# Patient Record
Sex: Male | Born: 1955 | Race: Asian | Hispanic: No | Marital: Married | Smoking: Former smoker
Health system: Southern US, Community
[De-identification: ages and names within clinical notes are randomized; demographics above are authoritative.]

---

## 2007-12-01 ENCOUNTER — Emergency Department (HOSPITAL_COMMUNITY): Admission: EM | Admit: 2007-12-01 | Discharge: 2007-12-01 | Payer: Self-pay | Admitting: Family Medicine

## 2007-12-15 ENCOUNTER — Ambulatory Visit: Payer: Self-pay | Admitting: Internal Medicine

## 2007-12-15 LAB — CONVERTED CEMR LAB
Basophils Absolute: 0 10*3/uL (ref 0.0–0.1)
Basophils Relative: 0 % (ref 0–1)
CO2: 24 meq/L (ref 19–32)
Calcium: 9.6 mg/dL (ref 8.4–10.5)
Chloride: 107 meq/L (ref 96–112)
Creatinine, Ser: 1.01 mg/dL (ref 0.40–1.50)
HDL: 63 mg/dL (ref >39)
Hemoglobin: 14.6 g/dL (ref 13.0–17.0)
LDL Cholesterol: 127 mg/dL — ABNORMAL HIGH (ref 0–99)
Lymphs Abs: 1.5 10*3/uL (ref 0.7–4.0)
MCHC: 32.4 g/dL (ref 30.0–36.0)
MCV: 92 fL (ref 78.0–100.0)
Monocytes Absolute: 0.4 10*3/uL (ref 0.1–1.0)
Monocytes Relative: 9 % (ref 3–12)
Neutro Abs: 2.7 10*3/uL (ref 1.7–7.7)
Neutrophils Relative %: 57 % (ref 43–77)
Platelets: 287 10*3/uL (ref 150–400)
Potassium: 5.1 meq/L (ref 3.5–5.3)
RDW: 12.5 % (ref 11.5–15.5)
Sodium: 143 meq/L (ref 135–145)
Total CHOL/HDL Ratio: 3.3
Triglycerides: 73 mg/dL (ref <150)
WBC: 4.7 10*3/uL (ref 4.0–10.5)

## 2008-08-12 ENCOUNTER — Ambulatory Visit: Payer: Self-pay | Admitting: Internal Medicine

## 2008-08-16 ENCOUNTER — Ambulatory Visit: Payer: Self-pay | Admitting: *Deleted

## 2008-09-16 ENCOUNTER — Ambulatory Visit: Payer: Self-pay | Admitting: Internal Medicine

## 2008-09-23 ENCOUNTER — Ambulatory Visit (HOSPITAL_COMMUNITY): Admission: RE | Admit: 2008-09-23 | Discharge: 2008-09-23 | Payer: Self-pay | Admitting: Internal Medicine

## 2009-01-06 ENCOUNTER — Ambulatory Visit: Payer: Self-pay | Admitting: Internal Medicine

## 2009-01-06 ENCOUNTER — Encounter (INDEPENDENT_AMBULATORY_CARE_PROVIDER_SITE_OTHER): Payer: Self-pay | Admitting: Adult Health

## 2009-01-06 LAB — CONVERTED CEMR LAB
AST: 19 units/L (ref 0–37)
Alkaline Phosphatase: 45 units/L (ref 39–117)
CO2: 25 meq/L (ref 19–32)
Chloride: 106 meq/L (ref 96–112)
Cholesterol: 191 mg/dL (ref 0–200)
Eosinophils Absolute: 0.1 10*3/uL (ref 0.0–0.7)
Glucose, Bld: 87 mg/dL (ref 70–99)
HDL: 68 mg/dL (ref >39)
LDL Cholesterol: 111 mg/dL — ABNORMAL HIGH (ref 0–99)
Lymphs Abs: 1.3 10*3/uL (ref 0.7–4.0)
MCHC: 33.4 g/dL (ref 30.0–36.0)
Monocytes Relative: 10 % (ref 3–12)
Neutrophils Relative %: 53 % (ref 43–77)
Sodium: 141 meq/L (ref 135–145)
T4, Total: 7.6 ug/dL (ref 5.0–12.5)
Total Bilirubin: 0.8 mg/dL (ref 0.3–1.2)
Total CHOL/HDL Ratio: 2.8
Triglycerides: 62 mg/dL (ref <150)

## 2009-02-14 ENCOUNTER — Ambulatory Visit: Payer: Self-pay | Admitting: Internal Medicine

## 2009-02-14 ENCOUNTER — Encounter (INDEPENDENT_AMBULATORY_CARE_PROVIDER_SITE_OTHER): Payer: Self-pay | Admitting: Adult Health

## 2009-02-14 LAB — CONVERTED CEMR LAB
Basophils Absolute: 0 10*3/uL (ref 0.0–0.1)
Hemoglobin: 14.3 g/dL (ref 13.0–17.0)
Lymphocytes Relative: 31 % (ref 12–46)
Lymphs Abs: 1.2 10*3/uL (ref 0.7–4.0)
MCHC: 33.3 g/dL (ref 30.0–36.0)
Monocytes Absolute: 0.3 10*3/uL (ref 0.1–1.0)
Monocytes Relative: 7 % (ref 3–12)
Neutrophils Relative %: 60 % (ref 43–77)
RDW: 12.9 % (ref 11.5–15.5)
WBC: 4 10*3/uL (ref 4.0–10.5)

## 2009-03-27 ENCOUNTER — Ambulatory Visit: Payer: Self-pay | Admitting: Internal Medicine

## 2009-04-07 ENCOUNTER — Ambulatory Visit: Payer: Self-pay | Admitting: Internal Medicine

## 2009-06-30 ENCOUNTER — Encounter: Admission: RE | Admit: 2009-06-30 | Discharge: 2009-06-30 | Payer: Self-pay | Admitting: Infectious Diseases

## 2011-07-01 ENCOUNTER — Other Ambulatory Visit: Payer: Self-pay | Admitting: Family Medicine

## 2011-07-01 DIAGNOSIS — N50812 Left testicular pain: Secondary | ICD-10-CM

## 2011-07-04 ENCOUNTER — Ambulatory Visit
Admission: RE | Admit: 2011-07-04 | Discharge: 2011-07-04 | Disposition: A | Discharge status: Home / Self Care | Payer: 59 | Source: Ambulatory Visit | Attending: Family Medicine | Admitting: Family Medicine

## 2011-07-04 DIAGNOSIS — N50812 Left testicular pain: Secondary | ICD-10-CM

## 2013-06-23 ENCOUNTER — Ambulatory Visit (INDEPENDENT_AMBULATORY_CARE_PROVIDER_SITE_OTHER): Payer: PRIVATE HEALTH INSURANCE | Admitting: Physician Assistant

## 2013-06-23 VITALS — BP 132/67 | HR 80 | Temp 99.2°F | Resp 18 | Ht 62.0 in | Wt 122.0 lb

## 2013-06-23 DIAGNOSIS — R05 Cough: Secondary | ICD-10-CM

## 2013-06-23 DIAGNOSIS — R059 Cough, unspecified: Secondary | ICD-10-CM

## 2013-06-23 DIAGNOSIS — B349 Viral infection, unspecified: Secondary | ICD-10-CM

## 2013-06-23 DIAGNOSIS — R509 Fever, unspecified: Secondary | ICD-10-CM

## 2013-06-23 DIAGNOSIS — B9789 Other viral agents as the cause of diseases classified elsewhere: Secondary | ICD-10-CM

## 2013-06-23 LAB — POCT CBC
Granulocyte percent: 86.6 %G — AB (ref 37–80)
HCT, POC: 46.3 % (ref 43.5–53.7)
Hemoglobin: 14.7 g/dL (ref 14.1–18.1)
Lymph, poc: 0.8 (ref 0.6–3.4)
MCH, POC: 29.6 pg (ref 27–31.2)
MCHC: 31.7 g/dL — AB (ref 31.8–35.4)
MCV: 93.2 fL (ref 80–97)
MID (cbc): 0.5 (ref 0–0.9)
MPV: 8.1 fL (ref 0–99.8)
POC Granulocyte: 8.5 — AB (ref 2–6.9)
POC LYMPH PERCENT: 8.3 %L — AB (ref 10–50)
POC MID %: 5.1 %M (ref 0–12)
Platelet Count, POC: 216 10*3/uL (ref 142–424)
RBC: 4.97 M/uL (ref 4.69–6.13)
RDW, POC: 13.5 %
WBC: 9.8 10*3/uL (ref 4.6–10.2)

## 2013-06-23 LAB — POCT INFLUENZA A/B
Influenza A, POC: NEGATIVE
Influenza B, POC: NEGATIVE

## 2013-06-23 MED ORDER — HYDROCOD POLST-CHLORPHEN POLST 10-8 MG/5ML PO LQCR: 5.0000 mL | Freq: Two times a day (BID) | ORAL | Status: AC | PRN

## 2013-06-23 MED ORDER — OSELTAMIVIR PHOSPHATE 75 MG PO CAPS: 75.0000 mg | ORAL_CAPSULE | Freq: Two times a day (BID) | ORAL | Status: DC

## 2013-06-23 NOTE — Progress Notes (Signed)
Subjective:    Patient ID: Jerry Fletcher, male    DOB: January 20, 1956, 58 y.o.   MRN: [id number]  HPI 58 year old male presents for evaluation of acute onset of fever, chills, body aches, nasal congestion, dry cough, intermittent headache, and fatigue. Symptoms started this morning and have progressively worsened throughout the day. He was unable to go to work today due to fatigue and body aches.  Admits his son was sick with similar illness 1 week ago.  He has taken 1 dose of Dayquil which has not helped much.      Review of Systems  Constitutional: Positive for fever, chills and fatigue.  HENT: Positive for congestion, postnasal drip and rhinorrhea. Negative for ear pain, sinus pressure and sore throat.   Respiratory: Positive for cough. Negative for chest tightness, shortness of breath and wheezing.   Cardiovascular: Negative for chest pain.  Gastrointestinal: Negative for nausea and vomiting.  Neurological: Positive for headaches. Negative for dizziness.       Objective:   Physical Exam  Constitutional: He is oriented to person, place, and time. He appears well-developed and well-nourished.  HENT:  Head: Normocephalic and atraumatic.  Right Ear: Hearing, tympanic membrane, external ear and ear canal normal.  Left Ear: Hearing, tympanic membrane, external ear and ear canal normal.  Mouth/Throat: Uvula is midline, oropharynx is clear and moist and mucous membranes are normal.  Eyes: Conjunctivae are normal.  Neck: Normal range of motion. Neck supple.  Cardiovascular: Normal rate, regular rhythm and normal heart sounds.   Pulmonary/Chest: Effort normal and breath sounds normal.  Lymphadenopathy:    He has no cervical adenopathy.  Neurological: He is alert and oriented to person, place, and time.  Psychiatric: He has a normal mood and affect. His behavior is normal. Judgment and thought content normal.      Results for orders placed in visit on 06/23/13  POCT INFLUENZA A/B   Result Value Range   Influenza A, POC Negative     Influenza B, POC Negative    POCT CBC      Result Value Range   WBC 9.8  4.6 - 10.2 K/uL   Lymph, poc 0.8  0.6 - 3.4   POC LYMPH PERCENT 8.3 (*) 10 - 50 %L   MID (cbc) 0.5  0 - 0.9   POC MID % 5.1  0 - 12 %M   POC Granulocyte 8.5 (*) 2 - 6.9   Granulocyte percent 86.6 (*) 37 - 80 %G   RBC 4.97  4.69 - 6.13 M/uL   Hemoglobin 14.7  14.1 - 18.1 g/dL   HCT, POC 40.9  81.1 - 53.7 %   MCV 93.2  80 - 97 fL   MCH, POC 29.6  27 - 31.2 pg   MCHC 31.7 (*) 31.8 - 35.4 g/dL   RDW, POC 91.4     Platelet Count, POC 216  142 - 424 K/uL   MPV 8.1  0 - 99.8 fL       Assessment & Plan:  Fever, unspecified - Plan: POCT Influenza A/B, POCT CBC  Viral illness - Plan: oseltamivir (TAMIFLU) 75 MG capsule  Cough - Plan: chlorpheniramine-HYDROcodone (TUSSIONEX PENNKINETIC ER) 10-8 MG/5ML LQCR  Influenza-like illness - ? Flu contacts. Patient reports son was treated for the flu but there is a slight language barrier.   Start Tamiflu 75 mg bid x 5 days. Tussionex qhs prn cough Continue Dayquil/Nyquil as directed if helpful.   Increase fluids and rest Out  of work until fever free for 24 hours  Follow up if symptoms worsen or fail to improve.

## 2014-12-06 ENCOUNTER — Encounter: Payer: Self-pay | Admitting: Family Medicine

## 2015-11-23 ENCOUNTER — Ambulatory Visit (INDEPENDENT_AMBULATORY_CARE_PROVIDER_SITE_OTHER): Payer: PRIVATE HEALTH INSURANCE | Admitting: Physician Assistant

## 2015-11-23 VITALS — BP 107/70 | HR 61 | Temp 97.7°F | Resp 16 | Ht 63.5 in | Wt 122.0 lb

## 2015-11-23 DIAGNOSIS — R05 Cough: Secondary | ICD-10-CM | POA: Diagnosis not present

## 2015-11-23 DIAGNOSIS — R059 Cough, unspecified: Secondary | ICD-10-CM

## 2015-11-23 MED ORDER — CETIRIZINE HCL 10 MG PO TABS: 10.0000 mg | ORAL_TABLET | Freq: Every day | ORAL | Status: DC

## 2015-11-23 MED ORDER — BENZONATATE 100 MG PO CAPS: ORAL_CAPSULE | ORAL | Status: AC

## 2015-11-23 MED ORDER — FLUTICASONE PROPIONATE 50 MCG/ACT NA SUSP: 2.0000 | Freq: Every day | NASAL | Status: AC

## 2015-11-23 NOTE — Patient Instructions (Addendum)
     IF you received an x-ray today, you will receive an invoice from [facility]. Please contact [facility] at [phone] with questions or concerns regarding your invoice.   IF you received labwork today, you will receive an invoice from [facility]. Please contact [facility] at [phone] with questions or concerns regarding your invoice.   Our billing staff will not be able to assist you with questions regarding bills from these companies.  You will be contacted with the lab results as soon as they are available. The fastest way to get your results is to activate your My Chart account. Instructions are located on the last page of this paperwork. If you have not heard from us regarding the results in 2 weeks, please contact this office.    Allergies An allergy is an abnormal reaction to a substance by the body's defense system (immune system). Allergies can develop at any age. WHAT CAUSES ALLERGIES? An allergic reaction happens when the immune system mistakenly reacts to a normally harmless substance, called an allergen, as if it were harmful. The immune system releases antibodies to fight the substance. Antibodies eventually release a chemical called histamine into the bloodstream. The release of histamine is meant to protect the body from infection, but it also causes discomfort. An allergic reaction can be triggered by:  Eating an allergen.  Inhaling an allergen.  Touching an allergen. WHAT TYPES OF ALLERGIES ARE THERE? There are many types of allergies. Common types include:  Seasonal allergies. People with this type of allergy are usually allergic to substances that are only present during certain seasons, such as molds and pollens.  Food allergies.  Drug allergies.  Insect allergies.  Animal dander allergies. WHAT ARE SYMPTOMS OF ALLERGIES? Possible allergy symptoms include:  Swelling of the lips, face, tongue, mouth, or  throat.  Sneezing, coughing, or wheezing.  Nasal congestion.  Tingling in the mouth.  Rash.  Itching.  Itchy, red, swollen areas of skin (hives).  Watery eyes.  Vomiting.  Diarrhea.  Dizziness.  Lightheadedness.  Fainting.  Trouble breathing or swallowing.  Chest tightness.  Rapid heartbeat. HOW ARE ALLERGIES DIAGNOSED? Allergies are diagnosed with a medical and family history and one or more of the following:  Skin tests.  Blood tests.  A food diary. A food diary is a record of all the foods and drinks you have in a day and of all the symptoms you experience.  The results of an elimination diet. An elimination diet involves eliminating foods from your diet and then adding them back in one by one to find out if a certain food causes an allergic reaction. HOW ARE ALLERGIES TREATED? There is no cure for allergies, but allergic reactions can be treated with medicine. Severe reactions usually need to be treated at a hospital. HOW CAN REACTIONS BE PREVENTED? The best way to prevent an allergic reaction is by avoiding the substance you are allergic to. Allergy shots and medicines can also help prevent reactions in some cases. People with severe allergic reactions may be able to prevent a life-threatening reaction called anaphylaxis with a medicine given right after exposure to the allergen.   This information is not intended to replace advice given to you by your health care provider. Make sure you discuss any questions you have with your health care provider.   Document Released: 07/30/2002 Document Revised: 05/27/2014 Document Reviewed: 02/15/2014 Elsevier Interactive Patient Education [employer]2016 Elsevier Inc.

## 2015-11-23 NOTE — Progress Notes (Signed)
   Jerry Fletcher  MRN: [id number] DOB: 05/14/1956  Subjective:  Pt presents to clinic with a cough for the last week.  He has no nasal congestion but does feel like there is stuff in the back of his throat - he also has itchy eyes for the same time frame.  He works inside and that has not changed for him.  He has used cold medications OTC.  There are no active problems to display for this patient.   Current Outpatient Prescriptions on File Prior to Visit  Medication Sig Dispense Refill  . chlorpheniramine-HYDROcodone (TUSSIONEX PENNKINETIC ER) 10-8 MG/5ML LQCR Take 5 mLs by mouth every 12 (twelve) hours as needed for cough (cough). (Patient not taking: Reported on 11/23/2015) 60 mL 0   No current facility-administered medications on file prior to visit.    No Known Allergies  Review of Systems  HENT: Positive for postnasal drip and sore throat. Negative for congestion and rhinorrhea.   Eyes: Positive for itching.  Respiratory: Positive for cough (am only white).        No h/o asthma, nonsmoker  Gastrointestinal: Negative.    Objective:  BP 107/70 mmHg  Pulse 61  Temp(Src) 97.7 F (36.5 C) (Oral)  Resp 16  Ht 5' 3.5" (1.613 m)  Wt 122 lb (55.339 kg)  BMI 21.27 kg/m2  SpO2 99%  Physical Exam  Constitutional: He is oriented to person, place, and time and well-developed, well-nourished, and in no distress.  HENT:  Head: Normocephalic and atraumatic.  Right Ear: Hearing, tympanic membrane, external ear and ear canal normal.  Left Ear: Hearing, tympanic membrane, external ear and ear canal normal.  Nose: Mucosal edema (pale turbinates) present.  Mouth/Throat: Uvula is midline, oropharynx is clear and moist and mucous membranes are normal.  Eyes: Conjunctivae and lids are normal.  Pale conjunctiva bilateral  Neck: Normal range of motion.  Cardiovascular: Normal rate, regular rhythm and normal heart sounds.   Pulmonary/Chest: Effort normal and breath sounds normal. He has no  wheezes.  Lymphadenopathy:       Head (right side): No tonsillar adenopathy present.       Head (left side): No tonsillar adenopathy present.    He has no cervical adenopathy.       Right: No supraclavicular adenopathy present.       Left: No supraclavicular adenopathy present.  Neurological: He is alert and oriented to person, place, and time. Gait normal.  Skin: Skin is warm and dry.  Psychiatric: Mood, memory, affect and judgment normal.    Assessment and Plan :  Cough - Plan: benzonatate (TESSALON) 100 MG capsule, cetirizine (ZYRTEC) 10 MG tablet, fluticasone (FLONASE) 50 MCG/ACT nasal spray   This appears to be allergies and we will treat - he will use the treatment for 2 weeks and then stop if his symptoms have resolved great but if they return he should restart the medications.  Benny LennertSarah Weber PA-C  [facility] 11/23/2015 11:04 AM

## 2016-01-08 ENCOUNTER — Other Ambulatory Visit: Payer: Self-pay | Admitting: Physician Assistant

## 2016-01-08 DIAGNOSIS — R05 Cough: Secondary | ICD-10-CM

## 2016-01-08 DIAGNOSIS — R059 Cough, unspecified: Secondary | ICD-10-CM

## 2016-03-13 ENCOUNTER — Other Ambulatory Visit: Payer: Self-pay | Admitting: Physician Assistant

## 2016-03-13 ENCOUNTER — Ambulatory Visit
Admission: RE | Admit: 2016-03-13 | Discharge: 2016-03-13 | Disposition: A | Discharge status: Home / Self Care | Payer: PRIVATE HEALTH INSURANCE | Source: Ambulatory Visit | Attending: Physician Assistant | Admitting: Physician Assistant

## 2016-03-13 DIAGNOSIS — R319 Hematuria, unspecified: Secondary | ICD-10-CM

## 2016-03-13 DIAGNOSIS — R109 Unspecified abdominal pain: Secondary | ICD-10-CM

## 2016-11-01 ENCOUNTER — Encounter: Payer: Self-pay | Admitting: Physician Assistant

## 2017-11-11 ENCOUNTER — Other Ambulatory Visit: Payer: Self-pay

## 2017-11-11 ENCOUNTER — Encounter: Payer: Self-pay | Admitting: Physician Assistant

## 2017-11-11 ENCOUNTER — Ambulatory Visit (INDEPENDENT_AMBULATORY_CARE_PROVIDER_SITE_OTHER): Payer: PRIVATE HEALTH INSURANCE | Admitting: Physician Assistant

## 2017-11-11 VITALS — BP 128/70 | HR 69 | Temp 98.6°F | Resp 18 | Ht 63.0 in | Wt 124.4 lb

## 2017-11-11 DIAGNOSIS — Z0000 Encounter for general adult medical examination without abnormal findings: Secondary | ICD-10-CM

## 2017-11-11 DIAGNOSIS — Z13220 Encounter for screening for lipoid disorders: Secondary | ICD-10-CM | POA: Diagnosis not present

## 2017-11-11 DIAGNOSIS — Z131 Encounter for screening for diabetes mellitus: Secondary | ICD-10-CM

## 2017-11-11 DIAGNOSIS — Z130 Encounter for screening for diseases of the blood and blood-forming organs and certain disorders involving the immune mechanism: Secondary | ICD-10-CM

## 2017-11-11 DIAGNOSIS — Z1321 Encounter for screening for nutritional disorder: Secondary | ICD-10-CM

## 2017-11-11 DIAGNOSIS — Z13228 Encounter for screening for other metabolic disorders: Secondary | ICD-10-CM | POA: Diagnosis not present

## 2017-11-11 DIAGNOSIS — Z1329 Encounter for screening for other suspected endocrine disorder: Secondary | ICD-10-CM

## 2017-11-11 DIAGNOSIS — Z87891 Personal history of nicotine dependence: Secondary | ICD-10-CM | POA: Diagnosis not present

## 2017-11-11 DIAGNOSIS — Z1159 Encounter for screening for other viral diseases: Secondary | ICD-10-CM | POA: Diagnosis not present

## 2017-11-11 DIAGNOSIS — Z125 Encounter for screening for malignant neoplasm of prostate: Secondary | ICD-10-CM | POA: Diagnosis not present

## 2017-11-11 DIAGNOSIS — Z1389 Encounter for screening for other disorder: Secondary | ICD-10-CM | POA: Diagnosis not present

## 2017-11-11 NOTE — Progress Notes (Signed)
Jerry Fletcher  MRN: [id number] DOB: [birth year]  PCP: Mancel Bale, PA-C   Chief Complaint  Patient presents with  . Annual Exam    Subjective:  Pt presents to clinic for a CPE. He states he is doing well overall and has no concerns.  Last dental exam: Q35month  Last vision exam: 2018 - Q1-2 years Last pap: NA Last mammo: NA Last colonoscopy: cannot recall, but normal outcome Vaccinations      Tetanus - cannot recall       HPV      Zostavax - cannot recall   Typical meals for patient: Rice, noodles, chicken, beef, cucumber  Typical beverage choices: Water, lemonade  Exercises: Bicycle times per week for 15 minutes for 3 times daily Sleeps: 6-8 hrs per night and well rested  There are no active problems to display for this patient.   Patient Care Team: WMittie Bodoas PCP - General (Physician Assistant)  Review of Systems  Constitutional: Negative.   HENT: Negative.   Eyes: Negative.   Respiratory: Negative.   Cardiovascular: Negative.   Gastrointestinal: Negative.   Endocrine: Negative.   Genitourinary: Negative.   Musculoskeletal: Negative.   Skin: Negative.   Allergic/Immunologic: Negative.   Neurological: Negative.   Hematological: Negative.   Psychiatric/Behavioral: Negative.      Current Outpatient Medications on File Prior to Visit  Medication Sig Dispense Refill  . acetaminophen (TYLENOL) 160 MG/5ML liquid Take by mouth every 4 (four) hours as needed for fever.    . cetirizine (ZYRTEC) 10 MG tablet TAKE 1 TABLET (10 MG TOTAL) BY MOUTH DAILY. (Patient not taking: Reported on 11/11/2017) 30 tablet 0  . chlorpheniramine-HYDROcodone (TUSSIONEX PENNKINETIC ER) 10-8 MG/5ML LQCR Take 5 mLs by mouth every 12 (twelve) hours as needed for cough (cough). (Patient not taking: Reported on 11/23/2015) 60 mL 0  . fluticasone (FLONASE) 50 MCG/ACT nasal spray Place 2 sprays into both nostrils daily. (Patient not taking: Reported on 11/11/2017) 16 g 1  .  guaiFENesin (ROBITUSSIN) 100 MG/5ML liquid Take 200 mg by mouth 3 (three) times daily as needed for cough.    . Phenylephrine-DM-GG (TUSSIN CF PO) Take by mouth.     No current facility-administered medications on file prior to visit.     No Known Allergies  Social History   Socioeconomic History  . Marital status: Married    Spouse name: Not on file  . Number of children: Not on file  . Years of education: Not on file  . Highest education level: Not on file  Occupational History  . Occupation: mGlass blower/designer Social Needs  . Financial resource strain: Not on file  . Food insecurity:    Worry: Not on file    Inability: Not on file  . Transportation needs:    Medical: Not on file    Non-medical: Not on file  Tobacco Use  . Smoking status: Former Smoker    Packs/day: 1.00    Years: 35.00    Pack years: 35.00    Types: Cigarettes    Start date: 1977    Last attempt to quit: 2012    Years since quitting: 7.4  . Smokeless tobacco: Never Used  Substance and Sexual Activity  . Alcohol use: Never    Frequency: Never  . Drug use: Never  . Sexual activity: Not Currently  Lifestyle  . Physical activity:    Days per week: Not on file    Minutes per session: Not on  file  . Stress: Not on file  Relationships  . Social connections:    Talks on phone: Not on file    Gets together: Not on file    Attends religious service: Not on file    Active member of club or organization: Not on file    Attends meetings of clubs or organizations: Not on file    Relationship status: Not on file  Other Topics Concern  . Not on file  Social History Narrative   Lives at home with wife and 2 kids (33 and 87 years old). Works as Glass blower/designer    History reviewed. No pertinent surgical history.  History reviewed. No pertinent family history.   Objective:  BP 128/70   Pulse 69   Temp 98.6 F (37 C) (Oral)   Resp 18   Ht '5\' 3"'  (1.6 m)   Wt 124 lb 6.4 oz (56.4 kg)   SpO2 99%    BMI 22.04 kg/m   Physical Exam  Constitutional: He is oriented to person, place, and time. He appears well-developed and well-nourished. No distress.  HENT:  Head: Normocephalic and atraumatic.  Right Ear: External ear normal.  Left Ear: External ear normal.  Nose: Nose normal.  Mouth/Throat: Oropharynx is clear and moist.  Eyes: Pupils are equal, round, and reactive to light. Conjunctivae and EOM are normal.  Neck: Normal range of motion. Neck supple. No thyromegaly present.  Cardiovascular: Normal rate, regular rhythm and normal heart sounds.  No murmur heard. Pulmonary/Chest: Effort normal and breath sounds normal. No stridor. He has no wheezes. He has no rales.  Abdominal:  Increased bowl sounds  Genitourinary: Rectum normal, prostate normal, testes normal and penis normal. Circumcised.  Musculoskeletal: Normal range of motion.  Lymphadenopathy:    He has no cervical adenopathy.  Neurological: He is alert and oriented to person, place, and time. He has normal strength.  Reflex Scores:      Bicep reflexes are 2+ on the right side and 2+ on the left side.      Patellar reflexes are 2+ on the right side and 2+ on the left side.      Achilles reflexes are 2+ on the right side and 2+ on the left side. Skin: Skin is warm and dry.  Psychiatric: He has a normal mood and affect. His behavior is normal. Judgment and thought content normal.    Wt Readings from Last 3 Encounters:  11/11/17 124 lb 6.4 oz (56.4 kg)  11/23/15 122 lb (55.3 kg)  06/23/13 122 lb (55.3 kg)     Visual Acuity Screening   Right eye Left eye Both eyes  Without correction: '20/40 20/30 20/40 '  With correction:       Assessment and Plan :  Annual physical exam  Screening for deficiency anemia - Plan: CBC with Differential/Platelet  Screening for metabolic disorder - Plan: CMP14+EGFR  Screening, lipid - Plan: Lipid panel  Screening for thyroid disorder - Plan: TSH  Screening for blood or protein in  urine - Plan: Urinalysis, dipstick only  Encounter for vitamin deficiency screening - Plan: VITAMIN D 25 Hydroxy (Vit-D Deficiency, Fractures)  Former smoker - Plan: Ambulatory referral to Pulmonology  Screening for diabetes mellitus - Plan: Hemoglobin A1c  Encounter for hepatitis C screening test for low risk patient - Plan: HCV Ab w Reflex to Quant PCR  Screening for prostate cancer - Plan: PSA  Age appropriate anticipatory guidance give.  Will get results of labs and determine next step  based on results.  Pt to work on getting [place] results from last office - for colonoscopy and vaccines.  Windell Hummingbird PA-C  [facility] 11/13/2017 1:34 AM  Please note: Portions of this report may have been transcribed using dragon voice recognition software. Every effort was made to ensure accuracy; however, inadvertent computerized transcription errors may be present.

## 2017-11-11 NOTE — Patient Instructions (Addendum)
Please download the APP called mychart - then use the text to activate this APP - this will allow you to look at your labs and contact me as well as make appointments to see me in the future.  I will contact you with your lab results as soon as they are available.   If you have not heard from me in 2 weeks, please contact me.  The fastest way to get your results is to register for My Chart (see the instructions on this printout).     IF you received an x-ray today, you will receive an invoice from [facility]. Please contact [facility] at [phone] with questions or concerns regarding your invoice.   IF you received labwork today, you will receive an invoice from [facility]. Please contact [facility] at [phone] with questions or concerns regarding your invoice.   Our billing staff will not be able to assist you with questions regarding bills from these companies.  You will be contacted with the lab results as soon as they are available. The fastest way to get your results is to activate your My Chart account. Instructions are located on the last page of this paperwork. If you have not heard from [place] regarding the results in 2 weeks, please contact this office.    Keeping you healthy  Get these tests  Blood pressure- Have your blood pressure checked once a year by your healthcare provider.  Normal blood pressure is 120/80  Weight- Have your body mass index (BMI) calculated to screen for obesity.  BMI is a measure of body fat based on height and weight. You can also calculate your own BMI at [email or web address].  Cholesterol- Have your cholesterol checked every year.  Diabetes- Have your blood sugar checked regularly if you have high blood pressure, high cholesterol, have a family history of diabetes or if you are overweight.  Screening for Colon Cancer- Colonoscopy starting at age 75.  Screening may begin sooner depending on your family history and other  health conditions. Follow up colonoscopy as directed by your Gastroenterologist.  Screening for Prostate Cancer- Both blood work (PSA) and a rectal exam help screen for Prostate Cancer.  Screening begins at age 4 with African-American men and at age 84 with Caucasian men.  Screening may begin sooner depending on your family history.  Take these medicines  Aspirin- One aspirin daily can help prevent Heart disease and Stroke.  Flu shot- Every fall.  Tetanus- Every 10 years.  Zostavax- Once after the age of 85 to prevent Shingles.  Pneumonia shot- Once after the age of 35; if you are younger than 74, ask your healthcare provider if you need a Pneumonia shot.  Take these steps  Don't smoke- If you do smoke, talk to your doctor about quitting.  For tips on how to quit, go to www.smokefree.gov or call 1-800-QUIT-NOW.  Be physically active- Exercise 5 days a week for at least 30 minutes.  If you are not already physically active start slow and gradually work up to 30 minutes of moderate physical activity.  Examples of moderate activity include walking briskly, mowing the yard, dancing, swimming, bicycling, etc.  Eat a healthy diet- Eat a variety of healthy food such as fruits, vegetables, low fat milk, low fat cheese, yogurt, lean meant, poultry, fish, beans, tofu, etc. For more information go to www.thenutritionsource.org  Drink alcohol in moderation- Limit alcohol intake to less than two drinks a day. Never drink and drive.  Dentist- Brush and floss  twice daily; visit your dentist twice a year.  Depression- Your emotional health is as important as your physical health. If you're feeling down, or losing interest in things you would normally enjoy please talk to your healthcare provider.  Eye exam- Visit your eye doctor every year.  Safe sex- If you may be exposed to a sexually transmitted infection, use a condom.  Seat belts- Seat belts can save your life; always wear  one.  Smoke/Carbon Monoxide detectors- These detectors need to be installed on the appropriate level of your home.  Replace batteries at least once a year.  Skin cancer- When out in the sun, cover up and use sunscreen 15 SPF or higher.  Violence- If anyone is threatening you, please tell your healthcare provider.  Living Will/ Health care power of attorney- Speak with your healthcare provider and family.

## 2017-11-12 LAB — CBC WITH DIFFERENTIAL/PLATELET
BASOS ABS: 0 10*3/uL (ref 0.0–0.2)
Basos: 1 %
EOS (ABSOLUTE): 0.2 10*3/uL (ref 0.0–0.4)
EOS: 3 %
HEMATOCRIT: 45.7 % (ref 37.5–51.0)
HEMOGLOBIN: 15.3 g/dL (ref 13.0–17.7)
Immature Grans (Abs): 0 10*3/uL (ref 0.0–0.1)
Immature Granulocytes: 0 %
LYMPHS ABS: 1.8 10*3/uL (ref 0.7–3.1)
Lymphs: 33 %
MCH: 29.2 pg (ref 26.6–33.0)
MCHC: 33.5 g/dL (ref 31.5–35.7)
MCV: 87 fL (ref 79–97)
MONOCYTES: 9 %
MONOS ABS: 0.5 10*3/uL (ref 0.1–0.9)
Neutrophils Absolute: 3 10*3/uL (ref 1.4–7.0)
Neutrophils: 54 %
Platelets: 250 10*3/uL (ref 150–450)
RBC: 5.24 x10E6/uL (ref 4.14–5.80)
RDW: 13.7 % (ref 12.3–15.4)
WBC: 5.4 10*3/uL (ref 3.4–10.8)

## 2017-11-12 LAB — LIPID PANEL
CHOL/HDL RATIO: 3.4 ratio (ref 0.0–5.0)
Cholesterol, Total: 201 mg/dL — ABNORMAL HIGH (ref 100–199)
HDL: 60 mg/dL (ref >39)
LDL Calculated: 117 mg/dL — ABNORMAL HIGH (ref 0–99)
Triglycerides: 121 mg/dL (ref 0–149)
VLDL CHOLESTEROL CAL: 24 mg/dL (ref 5–40)

## 2017-11-12 LAB — CMP14+EGFR
ALBUMIN: 4.8 g/dL (ref 3.6–4.8)
ALT: 43 IU/L (ref 0–44)
AST: 40 IU/L (ref 0–40)
Albumin/Globulin Ratio: 2 (ref 1.2–2.2)
Alkaline Phosphatase: 58 IU/L (ref 39–117)
BILIRUBIN TOTAL: 0.6 mg/dL (ref 0.0–1.2)
BUN / CREAT RATIO: 18 (ref 10–24)
BUN: 16 mg/dL (ref 8–27)
CO2: 24 mmol/L (ref 20–29)
Calcium: 9.2 mg/dL (ref 8.6–10.2)
Chloride: 103 mmol/L (ref 96–106)
Creatinine, Ser: 0.91 mg/dL (ref 0.76–1.27)
GFR calc Af Amer: 106 mL/min/{1.73_m2} (ref >59)
GFR calc non Af Amer: 91 mL/min/{1.73_m2} (ref >59)
GLOBULIN, TOTAL: 2.4 g/dL (ref 1.5–4.5)
Glucose: 90 mg/dL (ref 65–99)
POTASSIUM: 4.1 mmol/L (ref 3.5–5.2)
Sodium: 144 mmol/L (ref 134–144)
Total Protein: 7.2 g/dL (ref 6.0–8.5)

## 2017-11-12 LAB — URINALYSIS, DIPSTICK ONLY
Bilirubin, UA: NEGATIVE
GLUCOSE, UA: NEGATIVE
KETONES UA: NEGATIVE
LEUKOCYTES UA: NEGATIVE
Nitrite, UA: NEGATIVE
PROTEIN UA: NEGATIVE
RBC UA: NEGATIVE
Urobilinogen, Ur: 0.2 mg/dL (ref 0.2–1.0)
pH, UA: 5.5 (ref 5.0–7.5)

## 2017-11-12 LAB — HCV INTERPRETATION

## 2017-11-12 LAB — HCV AB W REFLEX TO QUANT PCR: HCV Ab: <0.1 s/co ratio (ref 0.0–0.9)

## 2017-11-12 LAB — HEMOGLOBIN A1C
ESTIMATED AVERAGE GLUCOSE: 114 mg/dL
Hgb A1c MFr Bld: 5.6 % (ref 4.8–5.6)

## 2017-11-12 LAB — VITAMIN D 25 HYDROXY (VIT D DEFICIENCY, FRACTURES): Vit D, 25-Hydroxy: 19.6 ng/mL — ABNORMAL LOW (ref 30.0–100.0)

## 2017-11-12 LAB — PSA: Prostate Specific Ag, Serum: 0.4 ng/mL (ref 0.0–4.0)

## 2017-11-12 LAB — TSH: TSH: 5.8 u[IU]/mL — ABNORMAL HIGH (ref 0.450–4.500)

## 2017-11-13 ENCOUNTER — Encounter: Payer: Self-pay | Admitting: Physician Assistant

## 2017-12-02 ENCOUNTER — Other Ambulatory Visit: Payer: Self-pay | Admitting: Acute Care

## 2017-12-02 DIAGNOSIS — Z87891 Personal history of nicotine dependence: Secondary | ICD-10-CM

## 2017-12-02 DIAGNOSIS — Z122 Encounter for screening for malignant neoplasm of respiratory organs: Secondary | ICD-10-CM

## 2017-12-17 ENCOUNTER — Encounter: Payer: Self-pay | Admitting: Acute Care

## 2017-12-17 ENCOUNTER — Ambulatory Visit (INDEPENDENT_AMBULATORY_CARE_PROVIDER_SITE_OTHER)
Admission: RE | Admit: 2017-12-17 | Discharge: 2017-12-17 | Disposition: A | Discharge status: Home / Self Care | Payer: PRIVATE HEALTH INSURANCE | Source: Ambulatory Visit | Attending: Acute Care | Admitting: Acute Care

## 2017-12-17 ENCOUNTER — Ambulatory Visit (INDEPENDENT_AMBULATORY_CARE_PROVIDER_SITE_OTHER): Payer: PRIVATE HEALTH INSURANCE | Admitting: Acute Care

## 2017-12-17 DIAGNOSIS — Z122 Encounter for screening for malignant neoplasm of respiratory organs: Secondary | ICD-10-CM

## 2017-12-17 DIAGNOSIS — Z87891 Personal history of nicotine dependence: Secondary | ICD-10-CM

## 2017-12-17 NOTE — Progress Notes (Signed)
Shared Decision Making Visit Lung Cancer Screening Program [phone]([id number])   Eligibility:  Age 62 y.o.  Pack Years Smoking History Calculation 35 pack year smoking history (# packs/per year x # years smoked)  Recent History of coughing up blood  no  Unexplained weight loss? no ( >Than 15 pounds within the last 6 months )  Prior History Lung / other cancer no (Diagnosis within the last 5 years already requiring surveillance chest CT Scans).  Smoking Status Former Smoker  Former Smokers: Years since quit: 10 years  Quit Date: 2012  Visit Components:  Discussion included one or more decision making aids. yes  Discussion included risk/benefits of screening. yes  Discussion included potential follow up diagnostic testing for abnormal scans. yes  Discussion included meaning and risk of over diagnosis. yes  Discussion included meaning and risk of False Positives. yes  Discussion included meaning of total radiation exposure. yes  Counseling Included:  Importance of adherence to annual lung cancer LDCT screening. yes  Impact of comorbidities on ability to participate in the program. yes  Ability and willingness to under diagnostic treatment. yes  Smoking Cessation Counseling:  Current Smokers:   Discussed importance of smoking cessation. yes  Information about tobacco cessation classes and interventions provided to patient. yes  Patient provided with "ticket" for LDCT Scan. yes  Symptomatic Patient. no  Counseling  Diagnosis Code: Tobacco Use Z72.0  Asymptomatic Patient yes  Counseling (Intermediate counseling: > three minutes counseling) [id number]  Former Smokers:   Discussed the importance of maintaining cigarette abstinence. yes  Diagnosis Code: Personal History of Nicotine Dependence. M57.846Z87.891  Information about tobacco cessation classes and interventions provided to patient. Yes  Patient provided with "ticket" for LDCT Scan. yes  Written Order for Lung Cancer  Screening with LDCT placed in Epic. Yes (CT Chest Lung Cancer Screening Low Dose W/O CM) [id number] Z12.2-Screening of respiratory organs Z87.891-Personal history of nicotine dependence  I spent 25 minutes of face to face time with Mr. Cyndie Chimeguyen discussing the risks and benefits of lung cancer screening. We viewed a power point together that explained in detail the above noted topics. We took the time to pause the power point at intervals to allow for questions to be asked and answered to ensure understanding. We discussed that he had taken the single most powerful action possible to decrease his risk of developing lung cancer when he quit smoking. I counseled him to remain smoke free, and to contact me if he ever had the desire to smoke again so that I can provide resources and tools to help support the effort to remain smoke free. We discussed the time and location of the scan, and that either  Abigail Miyamotoenise Phelps RN or I will call with the results within  24-48 hours of receiving them. He has my card and contact information in the event he needs to speak with me, in addition to a copy of the power point we reviewed as a resource. He verbalized understanding of all of the above and had no further questions upon leaving the office.     I explained to the patient that there has been a high incidence of coronary artery disease noted on these exams. I explained that this is a non-gated exam therefore degree or severity cannot be determined. This patient is not currently on statin therapy. I have asked the patient to follow-up with their PCP regarding any incidental finding of coronary artery disease and management with diet or medication as they feel is  clinically indicated. The patient verbalized understanding of the above and had no further questions.     Bevelyn Ngo, NP  12/17/2017 11:28 AM

## 2017-12-19 ENCOUNTER — Other Ambulatory Visit: Payer: Self-pay | Admitting: Acute Care

## 2017-12-19 DIAGNOSIS — Z122 Encounter for screening for malignant neoplasm of respiratory organs: Secondary | ICD-10-CM

## 2017-12-19 DIAGNOSIS — Z87891 Personal history of nicotine dependence: Secondary | ICD-10-CM

## 2017-12-26 ENCOUNTER — Ambulatory Visit: Payer: PRIVATE HEALTH INSURANCE | Admitting: Physician Assistant

## 2019-01-29 ENCOUNTER — Inpatient Hospital Stay: Admission: RE | Admit: 2019-01-29 | Payer: PRIVATE HEALTH INSURANCE | Source: Ambulatory Visit

## 2019-03-25 ENCOUNTER — Other Ambulatory Visit: Payer: Self-pay | Admitting: *Deleted

## 2019-03-25 DIAGNOSIS — Z87891 Personal history of nicotine dependence: Secondary | ICD-10-CM

## 2019-03-25 DIAGNOSIS — Z122 Encounter for screening for malignant neoplasm of respiratory organs: Secondary | ICD-10-CM

## 2019-06-18 ENCOUNTER — Telehealth: Payer: PRIVATE HEALTH INSURANCE | Admitting: Registered Nurse

## 2019-06-18 ENCOUNTER — Other Ambulatory Visit: Payer: Self-pay | Admitting: Registered Nurse

## 2019-06-18 ENCOUNTER — Telehealth (INDEPENDENT_AMBULATORY_CARE_PROVIDER_SITE_OTHER): Payer: PRIVATE HEALTH INSURANCE | Admitting: Registered Nurse

## 2019-06-18 ENCOUNTER — Other Ambulatory Visit: Payer: Self-pay

## 2019-06-18 DIAGNOSIS — E559 Vitamin D deficiency, unspecified: Secondary | ICD-10-CM

## 2019-06-18 DIAGNOSIS — G43019 Migraine without aura, intractable, without status migrainosus: Secondary | ICD-10-CM | POA: Diagnosis not present

## 2019-06-18 DIAGNOSIS — Z13228 Encounter for screening for other metabolic disorders: Secondary | ICD-10-CM

## 2019-06-18 DIAGNOSIS — Z125 Encounter for screening for malignant neoplasm of prostate: Secondary | ICD-10-CM

## 2019-06-18 DIAGNOSIS — Z130 Encounter for screening for diseases of the blood and blood-forming organs and certain disorders involving the immune mechanism: Secondary | ICD-10-CM

## 2019-06-18 DIAGNOSIS — Z0130 Encounter for examination of blood pressure without abnormal findings: Secondary | ICD-10-CM

## 2019-06-18 DIAGNOSIS — Z13220 Encounter for screening for lipoid disorders: Secondary | ICD-10-CM

## 2019-06-18 MED ORDER — SUMATRIPTAN SUCCINATE 50 MG PO TABS
50.0000 mg | ORAL_TABLET | ORAL | 0 refills | Status: AC | PRN
Start: 2019-06-18 — End: ?

## 2019-06-18 NOTE — Progress Notes (Signed)
Telemedicine Encounter- SOAP NOTE Established Patient  This telephone encounter was conducted with the patient's (or proxy's) verbal consent via audio telecommunications: yes  Patient was instructed to have this encounter in a suitably private space; and to only have persons present to whom they give permission to participate. In addition, patient identity was confirmed by use of name plus two identifiers (DOB and address).  I discussed the limitations, risks, security and privacy concerns of performing an evaluation and management service by telephone and the availability of in person appointments. I also discussed with the patient that there may be a patient responsible charge related to this service. The patient expressed understanding and agreed to proceed.  I spent a total of 13 minutes talking with the patient or their proxy.  No chief complaint on file.   Subjective   Jerry Fletcher is a 64 y.o. established patient. Telephone visit today for headache  HPI Onset around a week ago. Waxing and waning. Location moves around head. Slow onset, slow decline of symptoms. Acetaminophen has some effect Denies chest pain, visual changes, sensory changes, NVD, weakness numbness and tingling, doe, shob. No significant CV or neuro history   There are no problems to display for this patient.   No past medical history on file.  Current Outpatient Medications  Medication Sig Dispense Refill  . acetaminophen (TYLENOL) 160 MG/5ML liquid Take by mouth every 4 (four) hours as needed for fever.    . cetirizine (ZYRTEC) 10 MG tablet TAKE 1 TABLET (10 MG TOTAL) BY MOUTH DAILY. (Patient not taking: Reported on 11/11/2017) 30 tablet 0  . chlorpheniramine-HYDROcodone (TUSSIONEX PENNKINETIC ER) 10-8 MG/5ML LQCR Take 5 mLs by mouth every 12 (twelve) hours as needed for cough (cough). (Patient not taking: Reported on 11/23/2015) 60 mL 0  . fluticasone (FLONASE) 50 MCG/ACT nasal spray Place 2 sprays into  both nostrils daily. (Patient not taking: Reported on 11/11/2017) 16 g 1  . guaiFENesin (ROBITUSSIN) 100 MG/5ML liquid Take 200 mg by mouth 3 (three) times daily as needed for cough.    . Phenylephrine-DM-GG (TUSSIN CF PO) Take by mouth.    . SUMAtriptan (IMITREX) 50 MG tablet Take 1 tablet (50 mg total) by mouth every 2 (two) hours as needed for migraine. May repeat in 2 hours if headache persists or recurs. Then take one daily if headache persists thereafter. 10 tablet 0   No current facility-administered medications for this visit.    No Known Allergies  Social History   Socioeconomic History  . Marital status: Married    Spouse name: Not on file  . Number of children: Not on file  . Years of education: Not on file  . Highest education level: Not on file  Occupational History  . Occupation: Location manager  Tobacco Use  . Smoking status: Former Smoker    Packs/day: 1.00    Years: 35.00    Pack years: 35.00    Types: Cigarettes    Start date: 54    Quit date: 2012    Years since quitting: 9.0  . Smokeless tobacco: Never Used  Substance and Sexual Activity  . Alcohol use: Never  . Drug use: Never  . Sexual activity: Not Currently  Other Topics Concern  . Not on file  Social History Narrative   Lives at home with wife and 2 kids (58 and 29 years old). Works as Location manager   Social Determinants of Corporate investment banker Strain:   . Difficulty of  Paying Living Expenses: Not on file  Food Insecurity:   . Worried About Charity fundraiser in the Last Year: Not on file  . Ran Out of Food in the Last Year: Not on file  Transportation Needs:   . Lack of Transportation (Medical): Not on file  . Lack of Transportation (Non-Medical): Not on file  Physical Activity:   . Days of Exercise per Week: Not on file  . Minutes of Exercise per Session: Not on file  Stress:   . Feeling of Stress : Not on file  Social Connections:   . Frequency of Communication with  Friends and Family: Not on file  . Frequency of Social Gatherings with Friends and Family: Not on file  . Attends Religious Services: Not on file  . Active Member of Clubs or Organizations: Not on file  . Attends Archivist Meetings: Not on file  . Marital Status: Not on file  Intimate Partner Violence:   . Fear of Current or Ex-Partner: Not on file  . Emotionally Abused: Not on file  . Physically Abused: Not on file  . Sexually Abused: Not on file    ROS Per hpi  Objective   Vitals as reported by the patient: There were no vitals filed for this visit.  Diagnoses and all orders for this visit:  Intractable migraine without aura and without status migrainosus -     SUMAtriptan (IMITREX) 50 MG tablet; Take 1 tablet (50 mg total) by mouth every 2 (two) hours as needed for migraine. May repeat in 2 hours if headache persists or recurs. Then take one daily if headache persists thereafter.   PLAN  Sounds as migraines - reviewed sumatriptan with patient, also nonpharm. Pt demonstrates understanding  Reviewed red flags  Pt requesting to come in for labs and vitals - will place orders and have scheduling team call him  Patient encouraged to call clinic with any questions, comments, or concerns.    I discussed the assessment and treatment plan with the patient. The patient was provided an opportunity to ask questions and all were answered. The patient agreed with the plan and demonstrated an understanding of the instructions.   The patient was advised to call back or seek an in-person evaluation if the symptoms worsen or if the condition fails to improve as anticipated.  I provided 13 minutes of non-face-to-face time during this encounter.  Maximiano Coss, NP  Primary Care at [facility]

## 2019-06-18 NOTE — Progress Notes (Signed)
Lab orders placed following telemed encounter.  Jari Sportsman, NP

## 2019-06-18 NOTE — Patient Instructions (Signed)
° ° ° °  If you have lab work done today you will be contacted with your lab results within the next 2 weeks.  If you have not heard from us then please contact us. The fastest way to get your results is to register for My Chart. ° ° °IF you received an x-ray today, you will receive an invoice from [facility]. Please contact  Radiology at [phone] with questions or concerns regarding your invoice.  ° °IF you received labwork today, you will receive an invoice from [facility]. Please contact [facility] at [phone] with questions or concerns regarding your invoice.  ° °Our billing staff will not be able to assist you with questions regarding bills from these companies. ° °You will be contacted with the lab results as soon as they are available. The fastest way to get your results is to activate your My Chart account. Instructions are located on the last page of this paperwork. If you have not heard from us regarding the results in 2 weeks, please contact this office. °  ° ° ° °

## 2019-06-18 NOTE — Progress Notes (Signed)
Patient states he has a headache that comes and goes away for about one week. Patient took two tylenol for headache and helped some. He has not got a headache today , but have been coming back so that's what he would is concerned about and hope it stays gone.

## 2019-06-21 ENCOUNTER — Ambulatory Visit (INDEPENDENT_AMBULATORY_CARE_PROVIDER_SITE_OTHER): Payer: PRIVATE HEALTH INSURANCE | Admitting: Registered Nurse

## 2019-06-21 ENCOUNTER — Encounter: Payer: Self-pay | Admitting: Registered Nurse

## 2019-06-21 ENCOUNTER — Other Ambulatory Visit: Payer: Self-pay

## 2019-06-21 VITALS — BP 116/72 | HR 60 | Temp 98.5°F | Ht 63.0 in

## 2019-06-21 DIAGNOSIS — Z13220 Encounter for screening for lipoid disorders: Secondary | ICD-10-CM

## 2019-06-21 DIAGNOSIS — Z130 Encounter for screening for diseases of the blood and blood-forming organs and certain disorders involving the immune mechanism: Secondary | ICD-10-CM

## 2019-06-21 DIAGNOSIS — Z1329 Encounter for screening for other suspected endocrine disorder: Secondary | ICD-10-CM

## 2019-06-21 DIAGNOSIS — E559 Vitamin D deficiency, unspecified: Secondary | ICD-10-CM

## 2019-06-21 DIAGNOSIS — Z125 Encounter for screening for malignant neoplasm of prostate: Secondary | ICD-10-CM | POA: Diagnosis not present

## 2019-06-21 DIAGNOSIS — Z0130 Encounter for examination of blood pressure without abnormal findings: Secondary | ICD-10-CM

## 2019-06-21 DIAGNOSIS — B356 Tinea cruris: Secondary | ICD-10-CM | POA: Diagnosis not present

## 2019-06-21 DIAGNOSIS — Z13228 Encounter for screening for other metabolic disorders: Secondary | ICD-10-CM

## 2019-06-21 MED ORDER — KETOCONAZOLE 2 % EX CREA: 1.0000 "application " | TOPICAL_CREAM | Freq: Every day | CUTANEOUS | 1 refills | Status: AC

## 2019-06-21 NOTE — Patient Instructions (Signed)
° ° ° °  If you have lab work done today you will be contacted with your lab results within the next 2 weeks.  If you have not heard from us then please contact us. The fastest way to get your results is to register for My Chart. ° ° °IF you received an x-ray today, you will receive an invoice from [facility]. Please contact  Radiology at [phone] with questions or concerns regarding your invoice.  ° °IF you received labwork today, you will receive an invoice from [facility]. Please contact [facility] at [phone] with questions or concerns regarding your invoice.  ° °Our billing staff will not be able to assist you with questions regarding bills from these companies. ° °You will be contacted with the lab results as soon as they are available. The fastest way to get your results is to activate your My Chart account. Instructions are located on the last page of this paperwork. If you have not heard from us regarding the results in 2 weeks, please contact this office. °  ° ° ° °

## 2019-06-21 NOTE — Progress Notes (Signed)
Acute Office Visit  Subjective:    Patient ID: Jerry Fletcher, male    DOB: [birth year], 64 y.o.   MRN: [id number]  Chief Complaint  Patient presents with  . Rash    patient has a rash on right underarms , and keep having headahes.    HPI Patient is in today for headaches and rash  Headaches: was not able to pick up sumatriptan- $70 at pharmacy. We will provide goodrx coupon so he may try the medication for his migraine headaches  Rash: in groin and L axilla. Red, itching, mildly raised. Waxes and wanes. No discharge, drainage, or sloughing. Tried OTCs with no relief.   No past medical history on file.  No past surgical history on file.  No family history on file.  Social History   Socioeconomic History  . Marital status: Married    Spouse name: Not on file  . Number of children: Not on file  . Years of education: Not on file  . Highest education level: Not on file  Occupational History  . Occupation: Location manager  Tobacco Use  . Smoking status: Former Smoker    Packs/day: 1.00    Years: 35.00    Pack years: 35.00    Types: Cigarettes    Start date: 22    Quit date: 2012    Years since quitting: 9.0  . Smokeless tobacco: Never Used  Substance and Sexual Activity  . Alcohol use: Never  . Drug use: Never  . Sexual activity: Not Currently  Other Topics Concern  . Not on file  Social History Narrative   Lives at home with wife and 2 kids (79 and 73 years old). Works as Location manager   Social Determinants of Corporate investment banker Strain:   . Difficulty of Paying Living Expenses: Not on file  Food Insecurity:   . Worried About Programme researcher, broadcasting/film/video in the Last Year: Not on file  . Ran Out of Food in the Last Year: Not on file  Transportation Needs:   . Lack of Transportation (Medical): Not on file  . Lack of Transportation (Non-Medical): Not on file  Physical Activity:   . Days of Exercise per Week: Not on file  . Minutes of Exercise per Session:  Not on file  Stress:   . Feeling of Stress : Not on file  Social Connections:   . Frequency of Communication with Friends and Family: Not on file  . Frequency of Social Gatherings with Friends and Family: Not on file  . Attends Religious Services: Not on file  . Active Member of Clubs or Organizations: Not on file  . Attends Banker Meetings: Not on file  . Marital Status: Not on file  Intimate Partner Violence:   . Fear of Current or Ex-Partner: Not on file  . Emotionally Abused: Not on file  . Physically Abused: Not on file  . Sexually Abused: Not on file    Outpatient Medications Prior to Visit  Medication Sig Dispense Refill  . acetaminophen (TYLENOL) 160 MG/5ML liquid Take by mouth every 4 (four) hours as needed for fever.    . cetirizine (ZYRTEC) 10 MG tablet TAKE 1 TABLET (10 MG TOTAL) BY MOUTH DAILY. 30 tablet 0  . chlorpheniramine-HYDROcodone (TUSSIONEX PENNKINETIC ER) 10-8 MG/5ML LQCR Take 5 mLs by mouth every 12 (twelve) hours as needed for cough (cough). 60 mL 0  . fluticasone (FLONASE) 50 MCG/ACT nasal spray Place 2 sprays into both nostrils  daily. 16 g 1  . guaiFENesin (ROBITUSSIN) 100 MG/5ML liquid Take 200 mg by mouth 3 (three) times daily as needed for cough.    . Phenylephrine-DM-GG (TUSSIN CF PO) Take by mouth.    . SUMAtriptan (IMITREX) 50 MG tablet Take 1 tablet (50 mg total) by mouth every 2 (two) hours as needed for migraine. May repeat in 2 hours if headache persists or recurs. Then take one daily if headache persists thereafter. 10 tablet 0   No facility-administered medications prior to visit.    No Known Allergies  Review of Systems  Constitutional: Negative.   HENT: Negative.   Eyes: Negative.   Respiratory: Negative.   Cardiovascular: Negative.   Gastrointestinal: Negative.   Endocrine: Negative.   Genitourinary: Negative.   Musculoskeletal: Negative.   Skin: Positive for rash. Negative for color change, pallor and wound.    Allergic/Immunologic: Negative.   Neurological: Positive for headaches. Negative for dizziness, tremors, seizures, syncope, facial asymmetry, speech difficulty, weakness, light-headedness and numbness.  Hematological: Negative.   Psychiatric/Behavioral: Negative.   All other systems reviewed and are negative.      Objective:    Physical Exam Vitals and nursing note reviewed.  Constitutional:      Appearance: Normal appearance. He is normal weight.  HENT:     Head: Normocephalic.  Cardiovascular:     Rate and Rhythm: Normal rate and regular rhythm.  Pulmonary:     Effort: Pulmonary effort is normal. No respiratory distress.  Skin:    General: Skin is warm and dry.     Capillary Refill: Capillary refill takes less than 2 seconds.     Coloration: Skin is not jaundiced or pale.     Findings: Erythema and rash present. No bruising or lesion.       Neurological:     General: No focal deficit present.     Mental Status: He is alert and oriented to person, place, and time. Mental status is at baseline.  Psychiatric:        Mood and Affect: Mood normal.        Behavior: Behavior normal.        Thought Content: Thought content normal.        Judgment: Judgment normal.     BP 116/72   Pulse 60   Temp 98.5 F (36.9 C) (Temporal)   Ht 5\' 3"  (1.6 m)   BMI 22.04 kg/m  Wt Readings from Last 3 Encounters:  11/11/17 124 lb 6.4 oz (56.4 kg)  11/23/15 122 lb (55.3 kg)  06/23/13 122 lb (55.3 kg)    Health Maintenance Due  Topic Date Due  . COLONOSCOPY  05/21/2007    There are no preventive care reminders to display for this patient.   Lab Results  Component Value Date   TSH 5.800 (H) 11/11/2017   Lab Results  Component Value Date   WBC 5.4 11/11/2017   HGB 15.3 11/11/2017   HCT 45.7 11/11/2017   MCV 87 11/11/2017   PLT 250 11/11/2017   Lab Results  Component Value Date   NA 144 11/11/2017   K 4.1 11/11/2017   CO2 24 11/11/2017   GLUCOSE 90 11/11/2017   BUN 16  11/11/2017   CREATININE 0.91 11/11/2017   BILITOT 0.6 11/11/2017   ALKPHOS 58 11/11/2017   AST 40 11/11/2017   ALT 43 11/11/2017   PROT 7.2 11/11/2017   ALBUMIN 4.8 11/11/2017   CALCIUM 9.2 11/11/2017   Lab Results  Component Value Date  CHOL 201 (H) 11/11/2017   Lab Results  Component Value Date   HDL 60 11/11/2017   Lab Results  Component Value Date   LDLCALC 117 (H) 11/11/2017   Lab Results  Component Value Date   TRIG 121 11/11/2017   Lab Results  Component Value Date   CHOLHDL 3.4 11/11/2017   Lab Results  Component Value Date   HGBA1C 5.6 11/11/2017       Assessment & Plan:   Problem List Items Addressed This Visit    None    Visit Diagnoses    Tinea cruris    -  Primary   Relevant Medications   ketoconazole (NIZORAL) 2 % cream   Screening PSA (prostate specific antigen)       Screening for endocrine, metabolic and immunity disorder       Lipid screening       Vitamin D deficiency       Blood pressure check           Meds ordered this encounter  Medications  . ketoconazole (NIZORAL) 2 % cream    Sig: Apply 1 application topically daily.    Dispense:  15 g    Refill:  1    Order Specific Question:   Supervising Provider    Answer:   Delia Chimes A [id number]   PLAN  Tinea rash. Tx with ketoconazole qd PRN   Try sumatriptan for migraine headaches, return PRN  Otherwise, patient seems well. Labs drawn at today's visit per last week's orders  Patient encouraged to call clinic with any questions, comments, or concerns.   Maximiano Coss, NP

## 2019-06-22 ENCOUNTER — Other Ambulatory Visit: Payer: Self-pay | Admitting: Registered Nurse

## 2019-06-22 ENCOUNTER — Encounter: Payer: Self-pay | Admitting: Radiology

## 2019-06-22 DIAGNOSIS — E785 Hyperlipidemia, unspecified: Secondary | ICD-10-CM

## 2019-06-22 DIAGNOSIS — E559 Vitamin D deficiency, unspecified: Secondary | ICD-10-CM

## 2019-06-22 LAB — COMPREHENSIVE METABOLIC PANEL
ALT: 11 IU/L (ref 0–44)
AST: 19 IU/L (ref 0–40)
Albumin/Globulin Ratio: 2 (ref 1.2–2.2)
Albumin: 4.7 g/dL (ref 3.8–4.8)
Alkaline Phosphatase: 56 IU/L (ref 39–117)
BUN/Creatinine Ratio: 13 (ref 10–24)
BUN: 13 mg/dL (ref 8–27)
Bilirubin Total: 0.6 mg/dL (ref 0.0–1.2)
CO2: 24 mmol/L (ref 20–29)
Calcium: 9.2 mg/dL (ref 8.6–10.2)
Chloride: 101 mmol/L (ref 96–106)
Creatinine, Ser: 1.04 mg/dL (ref 0.76–1.27)
GFR calc Af Amer: 89 mL/min/{1.73_m2} (ref >59)
GFR calc non Af Amer: 77 mL/min/{1.73_m2} (ref >59)
Globulin, Total: 2.3 g/dL (ref 1.5–4.5)
Glucose: 99 mg/dL (ref 65–99)
Potassium: 4.1 mmol/L (ref 3.5–5.2)
Sodium: 141 mmol/L (ref 134–144)
Total Protein: 7 g/dL (ref 6.0–8.5)

## 2019-06-22 LAB — CBC
Hematocrit: 46.8 % (ref 37.5–51.0)
Hemoglobin: 15.9 g/dL (ref 13.0–17.7)
MCH: 29.7 pg (ref 26.6–33.0)
MCHC: 34 g/dL (ref 31.5–35.7)
MCV: 88 fL (ref 79–97)
Platelets: 243 10*3/uL (ref 150–450)
RBC: 5.35 x10E6/uL (ref 4.14–5.80)
RDW: 12.4 % (ref 11.6–15.4)
WBC: 4.7 10*3/uL (ref 3.4–10.8)

## 2019-06-22 LAB — LIPID PANEL
Chol/HDL Ratio: 3.5 ratio (ref 0.0–5.0)
Cholesterol, Total: 208 mg/dL — ABNORMAL HIGH (ref 100–199)
HDL: 60 mg/dL (ref >39)
LDL Chol Calc (NIH): 130 mg/dL — ABNORMAL HIGH (ref 0–99)
Triglycerides: 104 mg/dL (ref 0–149)
VLDL Cholesterol Cal: 18 mg/dL (ref 5–40)

## 2019-06-22 LAB — PSA: Prostate Specific Ag, Serum: 0.4 ng/mL (ref 0.0–4.0)

## 2019-06-22 LAB — HEMOGLOBIN A1C
Est. average glucose Bld gHb Est-mCnc: 120 mg/dL
Hgb A1c MFr Bld: 5.8 % — ABNORMAL HIGH (ref 4.8–5.6)

## 2019-06-22 LAB — VITAMIN D 25 HYDROXY (VIT D DEFICIENCY, FRACTURES): Vit D, 25-Hydroxy: 14.6 ng/mL — ABNORMAL LOW (ref 30.0–100.0)

## 2019-06-22 LAB — VITAMIN B12: Vitamin B-12: 328 pg/mL (ref 232–1245)

## 2019-06-22 LAB — TSH: TSH: 4.52 u[IU]/mL — ABNORMAL HIGH (ref 0.450–4.500)

## 2019-06-22 MED ORDER — VITAMIN D (ERGOCALCIFEROL) 1.25 MG (50000 UNIT) PO CAPS: 50000.0000 [IU] | ORAL_CAPSULE | ORAL | 0 refills | Status: DC

## 2019-06-22 MED ORDER — ATORVASTATIN CALCIUM 20 MG PO TABS: 20.0000 mg | ORAL_TABLET | Freq: Every day | ORAL | 3 refills | Status: AC

## 2019-06-22 NOTE — Progress Notes (Signed)
Good morning,  If we could call Mr. Jerry Fletcher to discuss labs, that would be great.  His lipids are a little high - I want to start him on Atorvastatin 20mg  daily with dinner. If he notices any muscle aches, urinary changes, or other concerns, let me know. We can recheck these numbers in 1 year.  His blood sugar is a little bit high. He should improve his diet my limiting processed foods and sugary beverages.  His Vitamin D is very low. I am going to send a prescription strength supplement that he can take once weekly for six weeks. After that, he should start a once daily over the counter supplement.   Otherwise, no concerns.   Thank you!  , NP

## 2019-06-22 NOTE — Progress Notes (Signed)
Called pt. To inform him of his lab results per NP. Pt. Called and asked if we could send him a letter with the results

## 2019-06-27 ENCOUNTER — Other Ambulatory Visit: Payer: Self-pay | Admitting: Registered Nurse

## 2019-06-27 DIAGNOSIS — E559 Vitamin D deficiency, unspecified: Secondary | ICD-10-CM

## 2019-06-29 ENCOUNTER — Telehealth: Payer: Self-pay | Admitting: Registered Nurse

## 2019-06-29 NOTE — Telephone Encounter (Signed)
LVM for pt to cb regarding his concerns with Vit d prescription

## 2019-06-29 NOTE — Telephone Encounter (Signed)
Pt has been taking his vitamin D prescription every day instead of once a week as prescribed. Please call pt to address his concerns [phone]

## 2019-07-12 NOTE — Telephone Encounter (Signed)
[phone] Pt returned cal

## 2019-07-25 IMAGING — CT CT CHEST LUNG CANCER SCREENING LOW DOSE W/O CM
1 series · 10 of 10 positions shown, 13 images · non-contrast
Comparison: 06/30/2009 chest radiograph.

CLINICAL DATA: 62-year-old asymptomatic male former smoker with 30
pack-year smoking history, quit smoking in 4227.

EXAM:
CT CHEST WITHOUT CONTRAST LOW-DOSE FOR LUNG CANCER SCREENING
TECHNIQUE: Multidetector CT imaging of the chest was performed following the
standard protocol without IV contrast.

[ct lung segmentation data · axial · 0.67mm/px · z∈[-523,-523]mm · 10 of 305 frames shown]
[frame 1/305  mediastinal]
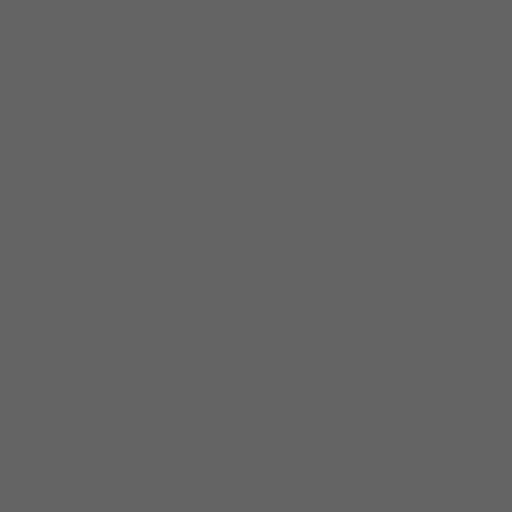
[frame 1/305  lung]
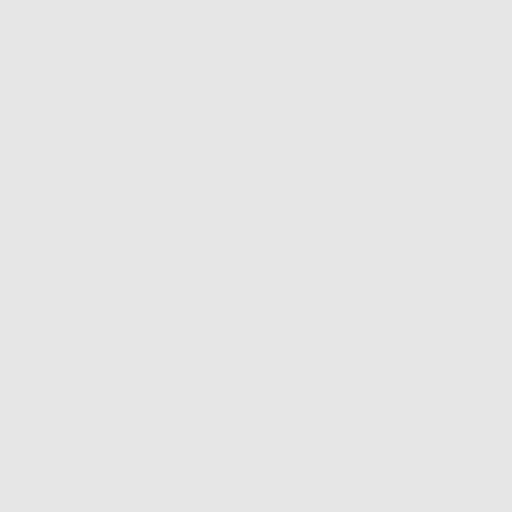
[frame 34/305  lung]
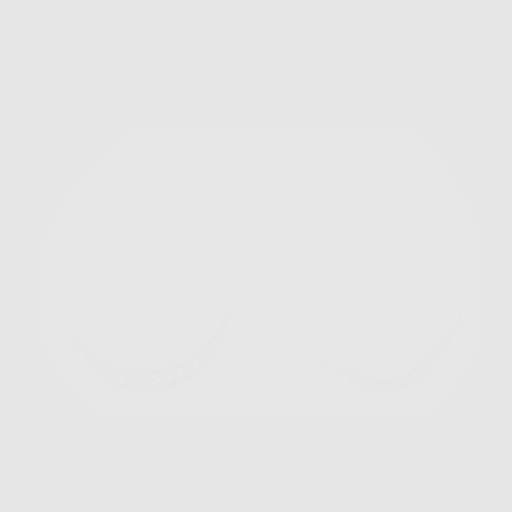
[frame 68/305  lung]
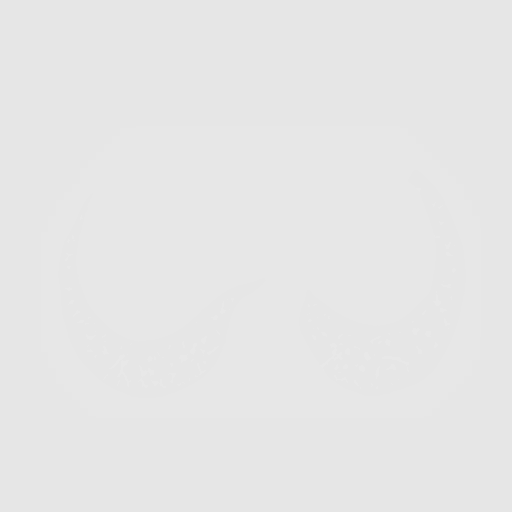
[frame 102/305  lung]
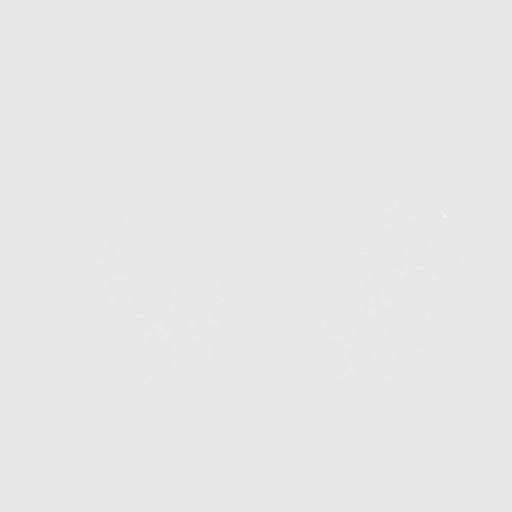
[frame 136/305  mediastinal]
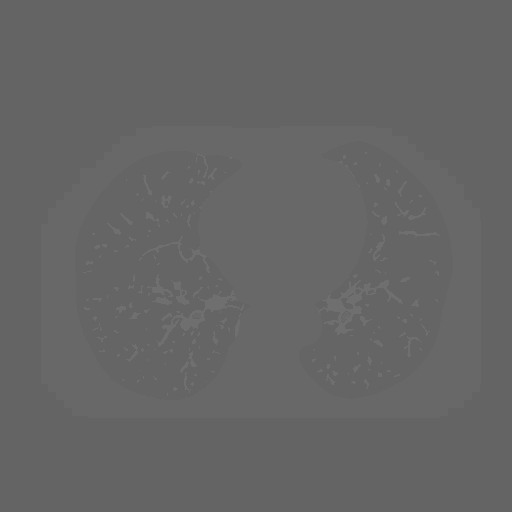
[frame 136/305  lung]
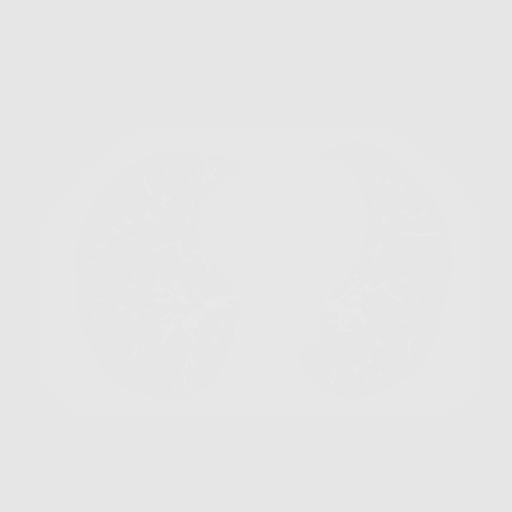
[frame 169/305  lung]
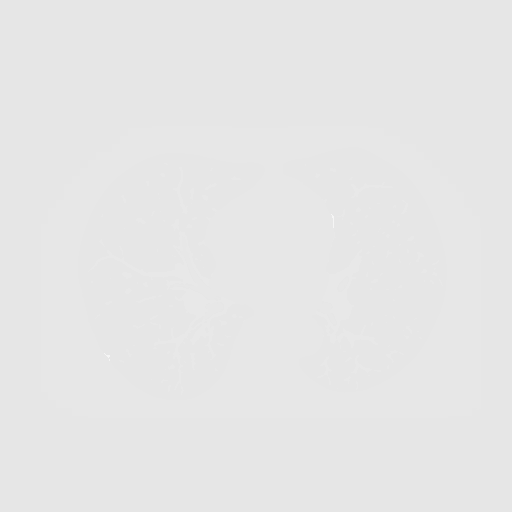
[frame 203/305  lung]
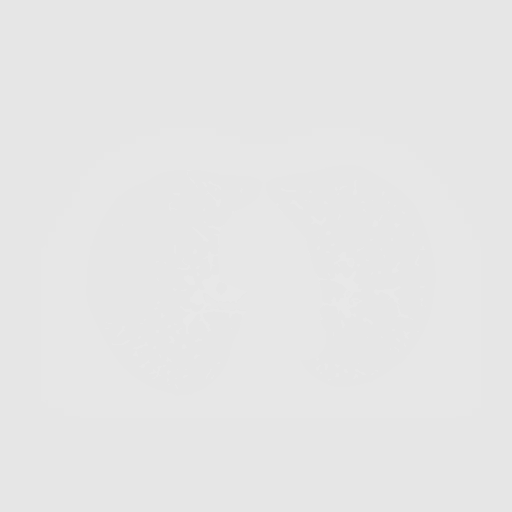
[frame 237/305  lung]
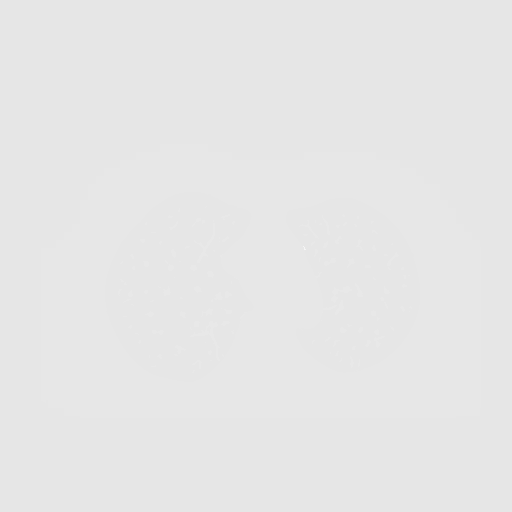
[frame 271/305  mediastinal]
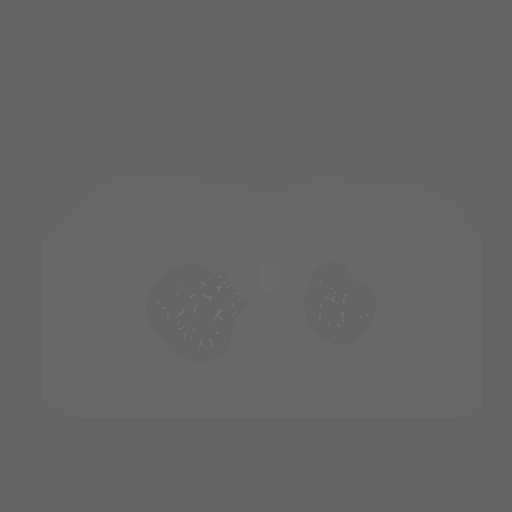
[frame 271/305  lung]
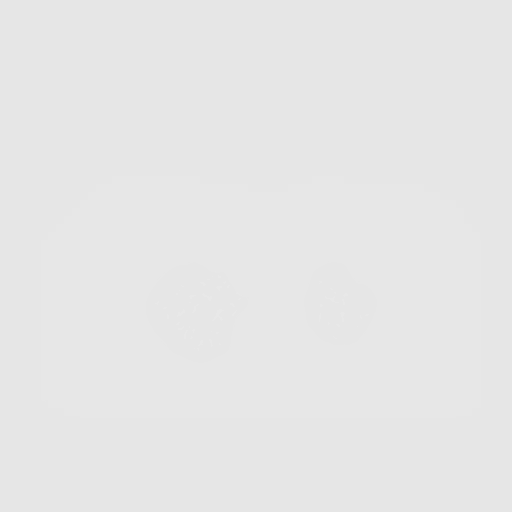
[frame 305/305  lung]
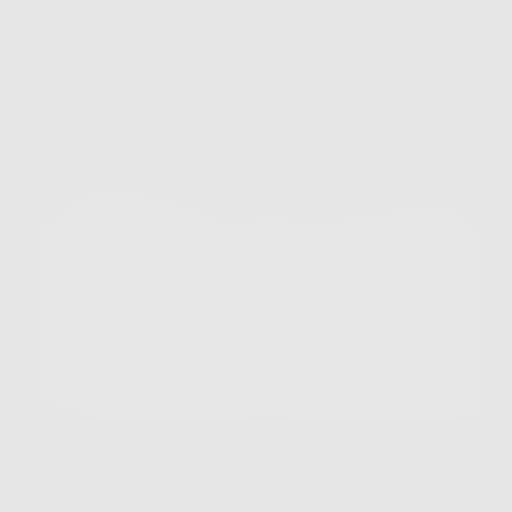

[10 of 10 positions shown; findings below may reference images not displayed]

FINDINGS: Cardiovascular: Normal heart size. No significant pericardial
effusion/thickening. Left anterior descending coronary
atherosclerosis. Atherosclerotic nonaneurysmal thoracic aorta.
Normal caliber pulmonary arteries.

Mediastinum/Nodes: No discrete thyroid nodules. Unremarkable
esophagus. No pathologically enlarged axillary, mediastinal or hilar
lymph nodes, noting limited sensitivity for the detection of hilar
adenopathy on this noncontrast study.

Lungs/Pleura: No pneumothorax. No pleural effusion. No acute
consolidative airspace disease or lung masses. A few small solid
pulmonary nodules are scattered in both lungs, largest 3.1 mm in
volume derived mean diameter in the left upper lobe along the major
fissure (series 3/image 153).

Upper abdomen: No acute abnormality.

Musculoskeletal: No aggressive appearing focal osseous lesions. Mild
thoracic spondylosis.
IMPRESSION: 1. Lung-RADS 2, benign appearance or behavior. Continue annual
screening with low-dose chest CT without contrast in 12 months.
2. One vessel coronary atherosclerosis.

Aortic Atherosclerosis ([id number].I).

## 2020-06-06 ENCOUNTER — Ambulatory Visit (HOSPITAL_COMMUNITY)
Admission: EM | Admit: 2020-06-06 | Discharge: 2020-06-06 | Disposition: A | Discharge status: Home / Self Care | Payer: PRIVATE HEALTH INSURANCE | Attending: Student | Admitting: Student

## 2020-06-06 ENCOUNTER — Encounter (HOSPITAL_COMMUNITY): Payer: Self-pay

## 2020-06-06 ENCOUNTER — Other Ambulatory Visit: Payer: Self-pay

## 2020-06-06 DIAGNOSIS — Z87891 Personal history of nicotine dependence: Secondary | ICD-10-CM | POA: Insufficient documentation

## 2020-06-06 DIAGNOSIS — U071 COVID-19: Secondary | ICD-10-CM | POA: Insufficient documentation

## 2020-06-06 DIAGNOSIS — Z79899 Other long term (current) drug therapy: Secondary | ICD-10-CM | POA: Insufficient documentation

## 2020-06-06 DIAGNOSIS — R519 Headache, unspecified: Secondary | ICD-10-CM | POA: Diagnosis present

## 2020-06-06 DIAGNOSIS — J069 Acute upper respiratory infection, unspecified: Secondary | ICD-10-CM

## 2020-06-06 NOTE — Discharge Instructions (Addendum)
-  Continue over the counter medications for management of your symptoms.  °-For fevers/chills, body aches, headaches- use Tylenol and Ibuprofen. You can alternate these for maximum effect. Use up to 3000mg Tylenol daily and 3200mg Ibuprofen daily. Make sure to take ibuprofen with food. Check the bottle of ibuprofen/tylenol for specific dosage instructions. ° ° °We are currently awaiting result of your PCR covid-19 test. This typically comes back in 1-2 days. We'll call you if the result is positive. Otherwise, the result will be sent electronically to your MyChart. You can also call this clinic and ask for your result via telephone.  ° °Please isolate at home while awaiting these results. If your test is positive for Covid-19, continue to isolate at home for 5 days if you have mild symptoms, or a total of 10 days from symptom onset if you have more severe symptoms. If you quarantine for a shorter period of time (i.e. 5 days), make sure to wear a mask until day 10 of symptoms. Treat your symptoms at home with OTC remedies like tylenol/ibuprofen, mucinex, nyquil, etc. Seek medical attention if you develop high fevers, chest pain, shortness of breath, ear pain, facial pain, etc. Make sure to get up and move around every 2-3 hours while convalescing to help prevent blood clots. Drink plenty of fluids, and rest as much as possible. ° °

## 2020-06-06 NOTE — ED Triage Notes (Signed)
Pt presented with body aches ha and sore throat staring Saturday night, pt has taken dayquill and nyquill. Patient has stated no fever. Patient may have been exposed to covid at work. Not vaccinated for covid and flu.

## 2020-06-06 NOTE — ED Provider Notes (Signed)
[facility]    CSN: [id number] Arrival date & time: 06/06/20  1441      History   Chief Complaint Chief Complaint  Patient presents with  . Headache  . Sore Throat  . Generalized Body Aches    HPI Jerry Fletcher is a 65 y.o. male Presenting for URI symptoms for 4 days following positive exposure at work. Presenting today with body aches, headache, sore throat. Taking dayquill and nyquill with some relief. Denies fevers. Denies fevers/chills, n/v/d, shortness of breath, chest pain, cough, congestion, facial pain, teeth pain, loss of taste/smell, swollen lymph nodes, ear pain.  Denies chest pain, shortness of breath, confusion, high fevers. Denies worst headache of life, thunderclap headache, weakness/sensation changes in arms/legs, vision changes, shortness of breath, chest pain/pressure, photophobia, phonophobia, n/v/d. Not vaccinated for covid-19.   HPI  History reviewed. No pertinent past medical history.  There are no problems to display for this patient.   History reviewed. No pertinent surgical history.     Home Medications    Prior to Admission medications   Medication Sig Start Date End Date Taking? Authorizing Provider  acetaminophen (TYLENOL) 160 MG/5ML liquid Take by mouth every 4 (four) hours as needed for fever.    [provider]  atorvastatin (LIPITOR) 20 MG tablet Take 1 tablet (20 mg total) by mouth daily. 06/22/19   Janeece Agee, NP  cetirizine (ZYRTEC) 10 MG tablet TAKE 1 TABLET (10 MG TOTAL) BY MOUTH DAILY. 01/08/16   Weber, Dema Severin, PA-C  chlorpheniramine-HYDROcodone (TUSSIONEX PENNKINETIC ER) 10-8 MG/5ML LQCR Take 5 mLs by mouth every 12 (twelve) hours as needed for cough (cough). 06/23/13   Nelva Nay, PA-C  fluticasone (FLONASE) 50 MCG/ACT nasal spray Place 2 sprays into both nostrils daily. 11/23/15   Weber, Dema Severin, PA-C  guaiFENesin (ROBITUSSIN) 100 MG/5ML liquid Take 200 mg by mouth 3 (three) times daily as needed for cough.     [provider]  ketoconazole (NIZORAL) 2 % cream Apply 1 application topically daily. 06/21/19   Janeece Agee, NP  Phenylephrine-DM-GG (TUSSIN CF PO) Take by mouth.    [provider]  SUMAtriptan (IMITREX) 50 MG tablet Take 1 tablet (50 mg total) by mouth every 2 (two) hours as needed for migraine. May repeat in 2 hours if headache persists or recurs. Then take one daily if headache persists thereafter. 06/18/19   Janeece Agee, NP  Vitamin D, Ergocalciferol, (DRISDOL) 1.25 MG (50000 UNIT) CAPS capsule Take 1 capsule by mouth once a week 06/27/19   Wandra Feinstein, MD    Family History History reviewed. No pertinent family history.  Social History Social History   Tobacco Use  . Smoking status: Former Smoker    Packs/day: 1.00    Years: 35.00    Pack years: 35.00    Types: Cigarettes    Start date: 27    Quit date: 2012    Years since quitting: 10.0  . Smokeless tobacco: Never Used  Substance Use Topics  . Alcohol use: Never  . Drug use: Never     Allergies   Patient has no known allergies.   Review of Systems Review of Systems  Constitutional: Negative for appetite change, chills and fever.  HENT: Positive for sore throat. Negative for congestion, ear pain, rhinorrhea, sinus pressure and sinus pain.   Eyes: Negative for redness and visual disturbance.  Respiratory: Negative for cough, chest tightness, shortness of breath and wheezing.   Cardiovascular: Negative for chest pain and palpitations.  Gastrointestinal: Negative for abdominal pain, constipation, diarrhea, nausea and vomiting.  Genitourinary: Negative for dysuria, frequency and urgency.  Musculoskeletal: Positive for myalgias.  Neurological: Positive for headaches. Negative for dizziness and weakness.  Psychiatric/Behavioral: Negative for confusion.  All other systems reviewed and are negative.    Physical Exam Triage Vital Signs ED Triage Vitals  Enc Vitals Group     BP --      Pulse  --      Resp --      Temp --      Temp src --      SpO2 --      Weight 06/06/20 1702 124 lb (56.2 kg)     Height 06/06/20 1702 5\' 5"  (1.651 m)     Head Circumference --      Peak Flow --      Pain Score 06/06/20 1701 3     Pain Loc --      Pain Edu? --      Excl. in GC? --    No data found.  Updated Vital Signs BP 120/70 (BP Location: Right Arm)   Temp 98.1 F (36.7 C) (Oral)   Resp 15   Ht 5\' 5"  (1.651 m)   Wt 124 lb (56.2 kg)   SpO2 100%   BMI 20.63 kg/m   Visual Acuity Right Eye Distance:   Left Eye Distance:   Bilateral Distance:    Right Eye Near:   Left Eye Near:    Bilateral Near:     Physical Exam Vitals reviewed.  Constitutional:      General: He is not in acute distress.    Appearance: Normal appearance. He is not ill-appearing.  HENT:     Head: Normocephalic and atraumatic.     Right Ear: Hearing, tympanic membrane, ear canal and external ear normal. No swelling or tenderness. There is no impacted cerumen. No mastoid tenderness. Tympanic membrane is not perforated, erythematous, retracted or bulging.     Left Ear: Hearing, tympanic membrane, ear canal and external ear normal. No swelling or tenderness. There is no impacted cerumen. No mastoid tenderness. Tympanic membrane is not perforated, erythematous, retracted or bulging.     Nose:     Right Sinus: No maxillary sinus tenderness or frontal sinus tenderness.     Left Sinus: No maxillary sinus tenderness or frontal sinus tenderness.     Mouth/Throat:     Mouth: Mucous membranes are moist.     Pharynx: Uvula midline. No oropharyngeal exudate or posterior oropharyngeal erythema.     Tonsils: No tonsillar exudate.  Cardiovascular:     Rate and Rhythm: Normal rate and regular rhythm.     Heart sounds: Normal heart sounds.  Pulmonary:     Breath sounds: Normal breath sounds and air entry. No wheezing, rhonchi or rales.  Chest:     Chest wall: No tenderness.  Abdominal:     General: Abdomen is flat.  Bowel sounds are normal.     Tenderness: There is no abdominal tenderness. There is no guarding or rebound.  Lymphadenopathy:     Cervical: No cervical adenopathy.  Neurological:     General: No focal deficit present.     Mental Status: He is alert and oriented to person, place, and time.  Psychiatric:        Attention and Perception: Attention and perception normal.        Mood and Affect: Mood and affect normal.        Behavior:  Behavior normal. Behavior is cooperative.        Thought Content: Thought content normal.        Judgment: Judgment normal.      UC Treatments / Results  Labs (all labs ordered are listed, but only abnormal results are displayed) Labs Reviewed  SARS CORONAVIRUS 2 (TAT 6-24 HRS)    EKG   Radiology No results found.  Procedures Procedures (including critical care time)  Medications Ordered in UC Medications - No data to display  Initial Impression / Assessment and Plan / UC Course  I have reviewed the triage vital signs and the nursing notes.  Pertinent labs & imaging results that were available during my care of the patient were reviewed by me and considered in my medical decision making (see chart for details).     Covid test sent today. Patient is not vaccinated for covid-19. Isolation precautions per CDC guidelines until negative result. Symptomatic relief with OTC Mucinex, Nyquil, etc. Return precautions- new/worsening fevers/chills, shortness of breath, chest pain, abd pain, etc.   Final Clinical Impressions(s) / UC Diagnoses   Final diagnoses:  Viral upper respiratory tract infection     Discharge Instructions     -Continue over the counter medications for management of your symptoms --For fevers/chills, body aches, headaches- use Tylenol and Ibuprofen. You can alternate these for maximum effect. Use up to 3000mg  Tylenol daily and 3200mg  Ibuprofen daily. Make sure to take ibuprofen with food. Check the bottle of ibuprofen/tylenol  for specific dosage instructions.   We are currently awaiting result of your PCR covid-19 test. This typically comes back in 1-2 days. We'll call you if the result is positive. Otherwise, the result will be sent electronically to your MyChart. You can also call this clinic and ask for your result via telephone.   Please isolate at home while awaiting these results. If your test is positive for Covid-19, continue to isolate at home for 5 days if you have mild symptoms, or a total of 10 days from symptom onset if you have more severe symptoms. If you quarantine for a shorter period of time (i.e. 5 days), make sure to wear a mask until day 10 of symptoms. Treat your symptoms at home with OTC remedies like tylenol/ibuprofen, mucinex, nyquil, etc. Seek medical attention if you develop high fevers, chest pain, shortness of breath, ear pain, facial pain, etc. Make sure to get up and move around every 2-3 hours while convalescing to help prevent blood clots. Drink plenty of fluids, and rest as much as possible.     ED Prescriptions    None     PDMP not reviewed this encounter.   , PA-C 06/06/20 1733

## 2020-06-07 LAB — SARS CORONAVIRUS 2 (TAT 6-24 HRS): SARS Coronavirus 2: POSITIVE — AB

## 2020-06-13 ENCOUNTER — Ambulatory Visit (HOSPITAL_COMMUNITY)
Admission: EM | Admit: 2020-06-13 | Discharge: 2020-06-13 | Disposition: A | Discharge status: Home / Self Care | Payer: PRIVATE HEALTH INSURANCE

## 2021-10-23 ENCOUNTER — Other Ambulatory Visit: Payer: Self-pay | Admitting: *Deleted

## 2021-10-23 DIAGNOSIS — Z87891 Personal history of nicotine dependence: Secondary | ICD-10-CM

## 2021-10-23 DIAGNOSIS — Z122 Encounter for screening for malignant neoplasm of respiratory organs: Secondary | ICD-10-CM

## 2021-10-30 ENCOUNTER — Ambulatory Visit (INDEPENDENT_AMBULATORY_CARE_PROVIDER_SITE_OTHER)
Admission: RE | Admit: 2021-10-30 | Discharge: 2021-10-30 | Disposition: A | Discharge status: Home / Self Care | Payer: PRIVATE HEALTH INSURANCE | Source: Ambulatory Visit | Attending: Acute Care | Admitting: Acute Care

## 2021-10-30 DIAGNOSIS — Z122 Encounter for screening for malignant neoplasm of respiratory organs: Secondary | ICD-10-CM | POA: Diagnosis not present

## 2021-10-30 DIAGNOSIS — Z87891 Personal history of nicotine dependence: Secondary | ICD-10-CM | POA: Diagnosis not present

## 2021-11-01 ENCOUNTER — Other Ambulatory Visit: Payer: Self-pay | Admitting: Acute Care

## 2021-11-01 DIAGNOSIS — Z87891 Personal history of nicotine dependence: Secondary | ICD-10-CM

## 2021-11-01 DIAGNOSIS — Z122 Encounter for screening for malignant neoplasm of respiratory organs: Secondary | ICD-10-CM

## 2022-10-31 ENCOUNTER — Ambulatory Visit (HOSPITAL_COMMUNITY)
Admission: RE | Admit: 2022-10-31 | Discharge: 2022-10-31 | Disposition: A | Discharge status: Home / Self Care | Payer: PRIVATE HEALTH INSURANCE | Source: Ambulatory Visit | Attending: Acute Care | Admitting: Acute Care

## 2022-10-31 DIAGNOSIS — J432 Centrilobular emphysema: Secondary | ICD-10-CM | POA: Insufficient documentation

## 2022-10-31 DIAGNOSIS — Z87891 Personal history of nicotine dependence: Secondary | ICD-10-CM | POA: Insufficient documentation

## 2022-10-31 DIAGNOSIS — I2510 Atherosclerotic heart disease of native coronary artery without angina pectoris: Secondary | ICD-10-CM | POA: Insufficient documentation

## 2022-10-31 DIAGNOSIS — I700 Atherosclerosis of aorta: Secondary | ICD-10-CM | POA: Insufficient documentation

## 2022-10-31 DIAGNOSIS — Z122 Encounter for screening for malignant neoplasm of respiratory organs: Secondary | ICD-10-CM | POA: Insufficient documentation

## 2022-11-06 ENCOUNTER — Other Ambulatory Visit: Payer: Self-pay

## 2022-11-06 DIAGNOSIS — Z122 Encounter for screening for malignant neoplasm of respiratory organs: Secondary | ICD-10-CM

## 2022-11-06 DIAGNOSIS — Z87891 Personal history of nicotine dependence: Secondary | ICD-10-CM

## 2023-11-04 ENCOUNTER — Other Ambulatory Visit: Payer: Self-pay | Admitting: Acute Care

## 2023-11-04 DIAGNOSIS — Z122 Encounter for screening for malignant neoplasm of respiratory organs: Secondary | ICD-10-CM

## 2023-11-04 DIAGNOSIS — Z87891 Personal history of nicotine dependence: Secondary | ICD-10-CM

## 2023-11-19 ENCOUNTER — Ambulatory Visit (HOSPITAL_COMMUNITY): Payer: Self-pay

## 2023-12-09 ENCOUNTER — Ambulatory Visit (HOSPITAL_COMMUNITY)
Admission: RE | Admit: 2023-12-09 | Discharge: 2023-12-09 | Disposition: A | Discharge status: Home / Self Care | Payer: Self-pay | Source: Ambulatory Visit | Attending: Acute Care | Admitting: Acute Care

## 2023-12-09 DIAGNOSIS — Z87891 Personal history of nicotine dependence: Secondary | ICD-10-CM | POA: Insufficient documentation

## 2023-12-09 DIAGNOSIS — Z122 Encounter for screening for malignant neoplasm of respiratory organs: Secondary | ICD-10-CM | POA: Diagnosis present

## 2023-12-17 ENCOUNTER — Other Ambulatory Visit: Payer: Self-pay

## 2023-12-17 DIAGNOSIS — Z122 Encounter for screening for malignant neoplasm of respiratory organs: Secondary | ICD-10-CM

## 2023-12-17 DIAGNOSIS — Z87891 Personal history of nicotine dependence: Secondary | ICD-10-CM
